# Patient Record
Sex: Male | Born: 1980 | Race: White | Hispanic: No | Smoking: Never smoker
Health system: Southern US, Community
[De-identification: ages and names within clinical notes are randomized; demographics above are authoritative.]

---

## 2010-09-15 ENCOUNTER — Emergency Department (HOSPITAL_COMMUNITY)
Admission: EM | Admit: 2010-09-15 | Discharge: 2010-09-15 | Payer: Self-pay | Source: Home / Self Care | Admitting: Emergency Medicine

## 2010-12-04 LAB — RAPID URINE DRUG SCREEN, HOSP PERFORMED
Amphetamines: NOT DETECTED
Barbiturates: NOT DETECTED
Cocaine: POSITIVE — AB
Tetrahydrocannabinol: POSITIVE — AB

## 2010-12-04 LAB — DIFFERENTIAL
Basophils Absolute: 0 10*3/uL (ref 0.0–0.1)
Monocytes Relative: 11 % (ref 3–12)
Neutro Abs: 8.1 10*3/uL — ABNORMAL HIGH (ref 1.7–7.7)

## 2010-12-04 LAB — CBC
HCT: 39.6 % (ref 39.0–52.0)
MCH: 33.6 pg (ref 26.0–34.0)
MCV: 90 fL (ref 78.0–100.0)
Platelets: 152 10*3/uL (ref 150–400)
RDW: 11.8 % (ref 11.5–15.5)

## 2010-12-04 LAB — BASIC METABOLIC PANEL
GFR calc Af Amer: >60 mL/min (ref >60)
GFR calc non Af Amer: >60 mL/min (ref >60)

## 2011-02-19 ENCOUNTER — Emergency Department: Payer: Self-pay | Admitting: Emergency Medicine

## 2011-05-09 ENCOUNTER — Inpatient Hospital Stay: Payer: Self-pay | Admitting: Internal Medicine

## 2011-11-17 ENCOUNTER — Emergency Department: Payer: Self-pay | Admitting: *Deleted

## 2011-11-17 LAB — BASIC METABOLIC PANEL
Anion Gap: 8 (ref 7–16)
BUN: 7 mg/dL (ref 7–18)
Calcium, Total: 8.9 mg/dL (ref 8.5–10.1)
Chloride: 102 mmol/L (ref 98–107)
Co2: 32 mmol/L (ref 21–32)
Osmolality: 281 (ref 275–301)
Potassium: 3.4 mmol/L — ABNORMAL LOW (ref 3.5–5.1)

## 2011-11-17 LAB — CBC
HGB: 14.8 g/dL (ref 13.0–18.0)
MCH: 31.8 pg (ref 26.0–34.0)
MCV: 94 fL (ref 80–100)
RBC: 4.67 10*6/uL (ref 4.40–5.90)
RDW: 12.8 % (ref 11.5–14.5)

## 2013-02-04 IMAGING — CT CT CHEST W/ CM
1 of 2 series · 14 of 32 positions shown, 19 images · IV contrast (APPLIED)
Comparison: none

REASON FOR EXAM: dyspnea, hypoxia
COMMENTS:

[Series 4: soft tissue · axial · 0.67mm/px · z∈[-338,-38]mm · 14 of 112 slices shown, 19 images]
[im 6/112  soft-tissue]
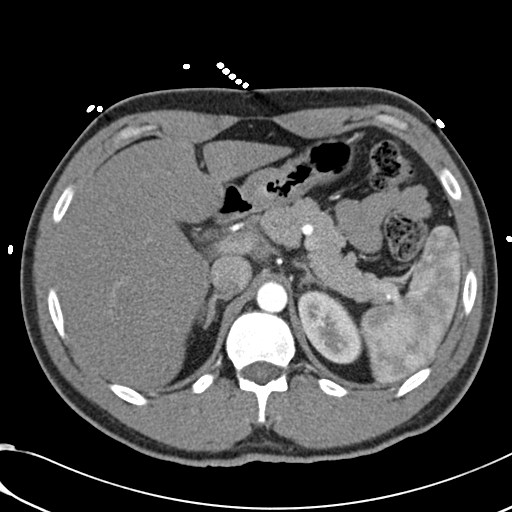
[im 6/112  bone]
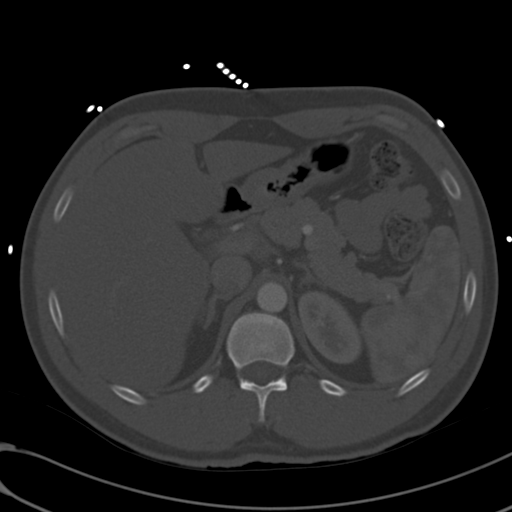
[im 18/112  soft-tissue]
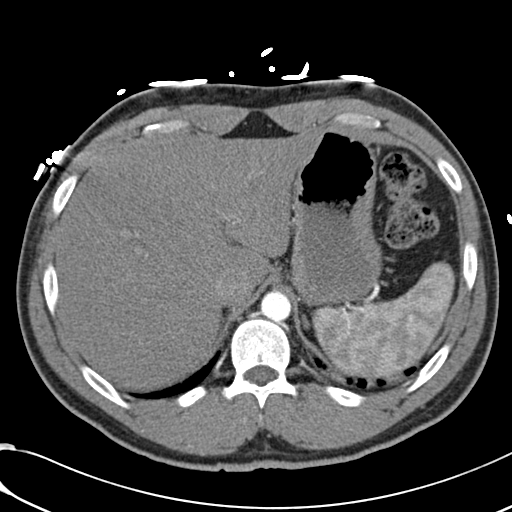
[im 24/112  soft-tissue]
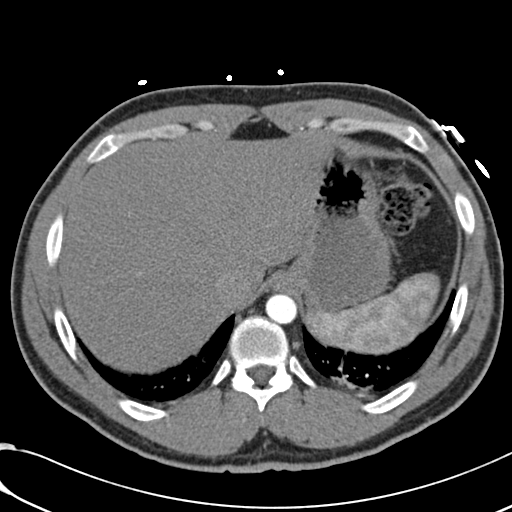
[im 30/112  soft-tissue]
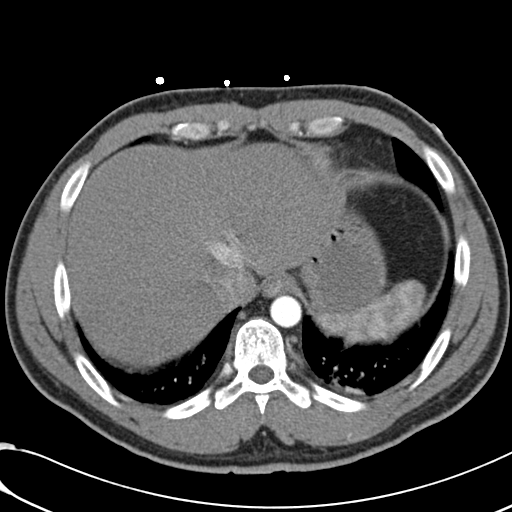
[im 41/112  soft-tissue]
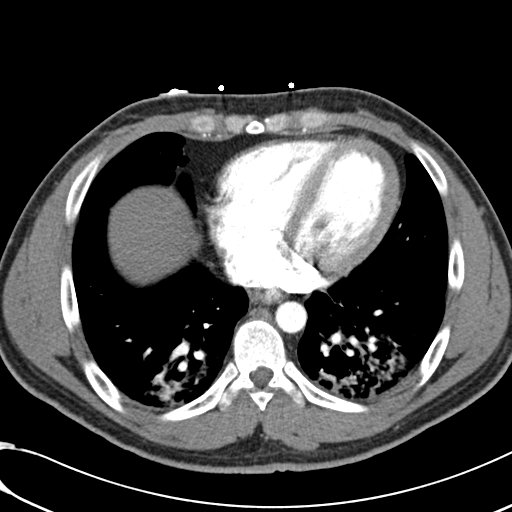
[im 47/112  soft-tissue]
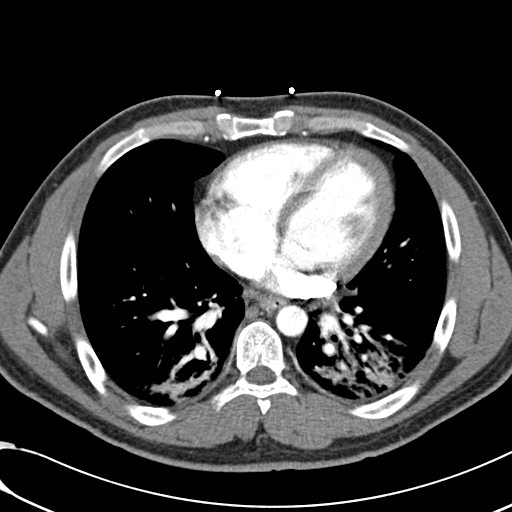
[im 59/112  soft-tissue]
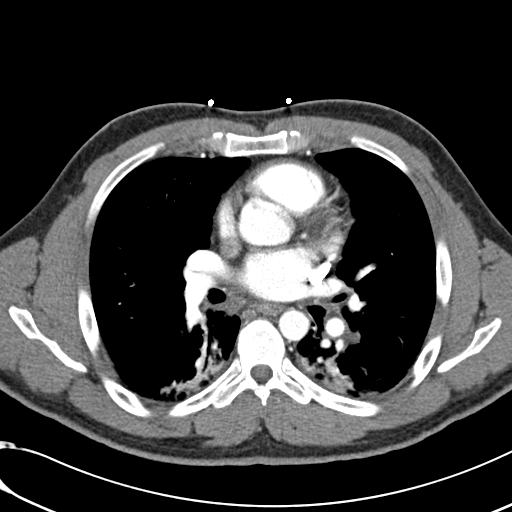
[im 65/112  soft-tissue]
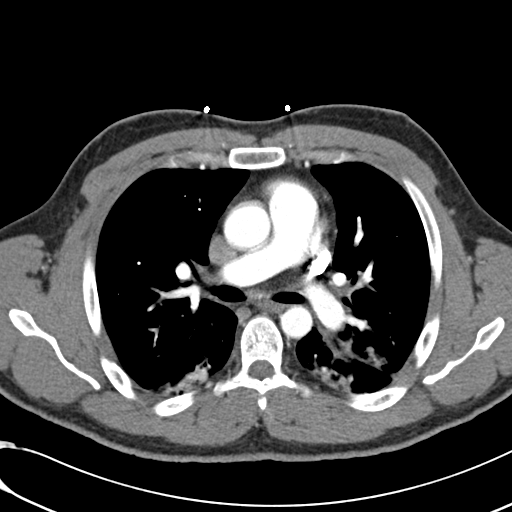
[im 71/112  soft-tissue]
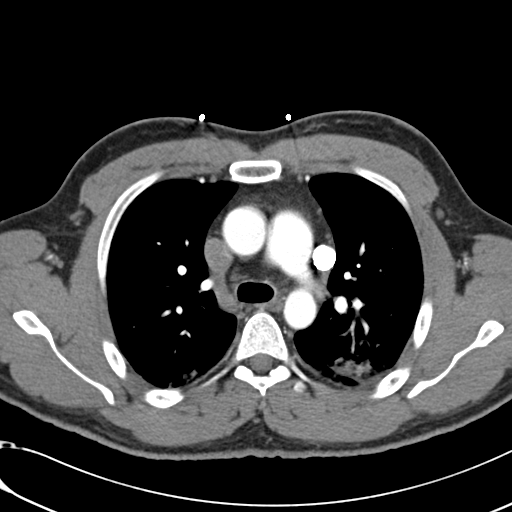
[im 71/112  bone]
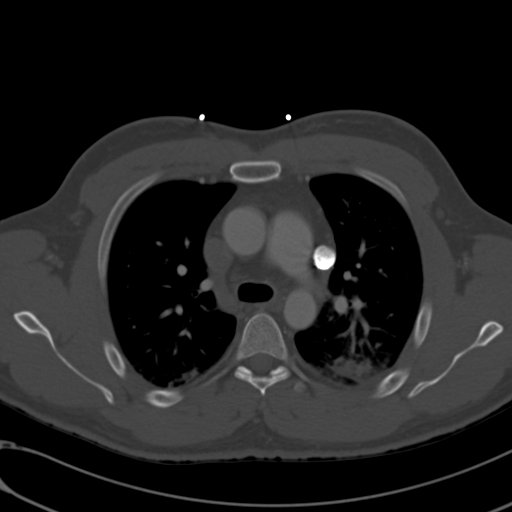
[im 82/112  soft-tissue]
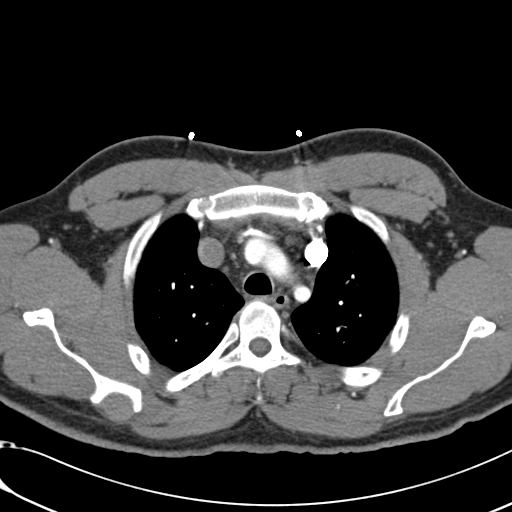
[im 88/112  soft-tissue]
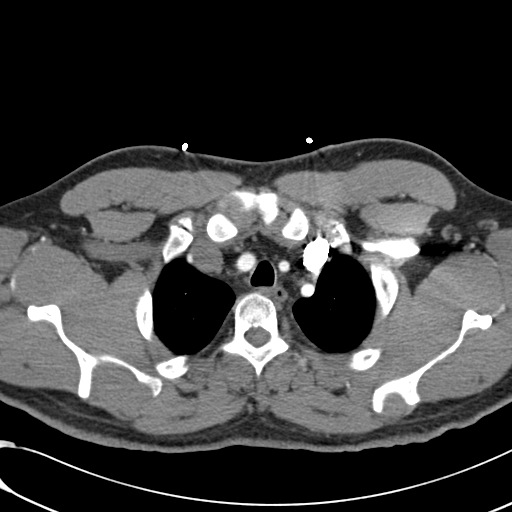
[im 88/112  lung]
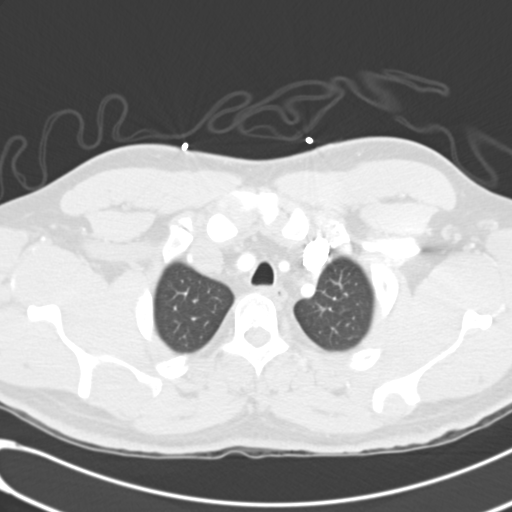
[im 94/112  soft-tissue]
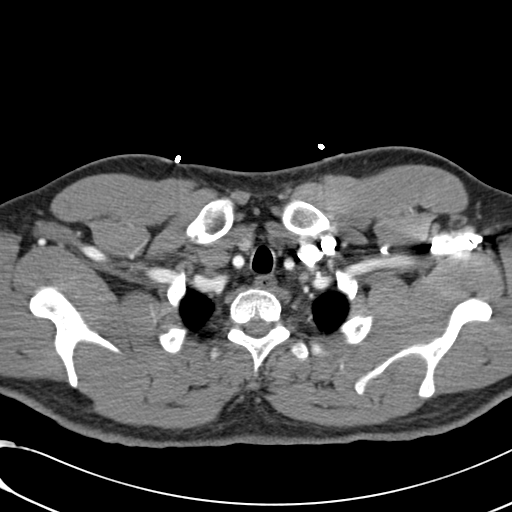
[im 94/112  lung]
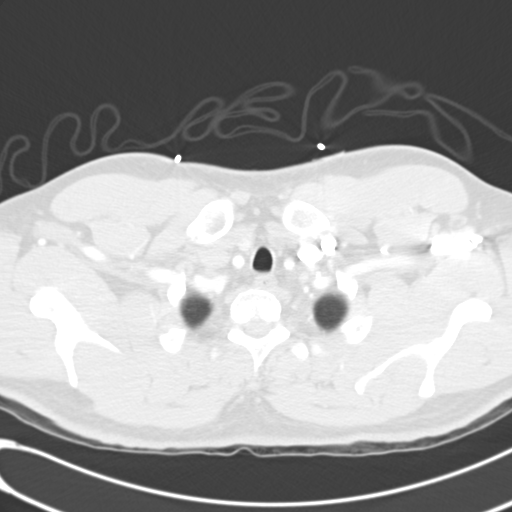
[im 100/112  lung]
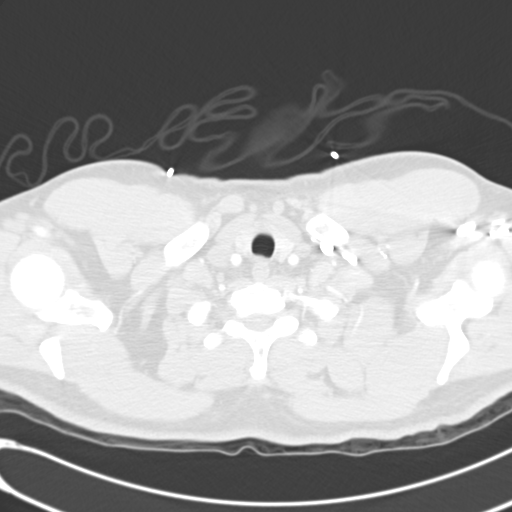
[im 106/112  soft-tissue]
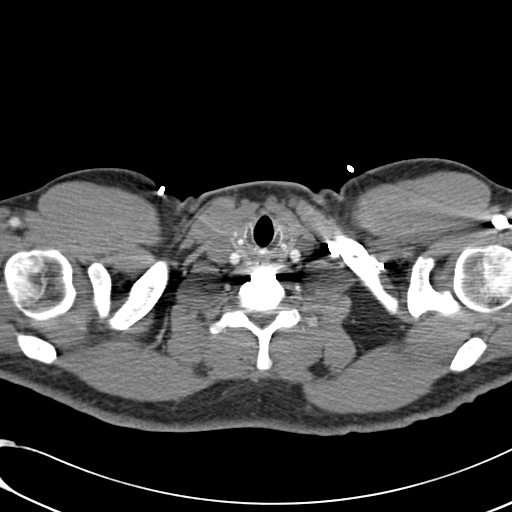
[im 106/112  lung]
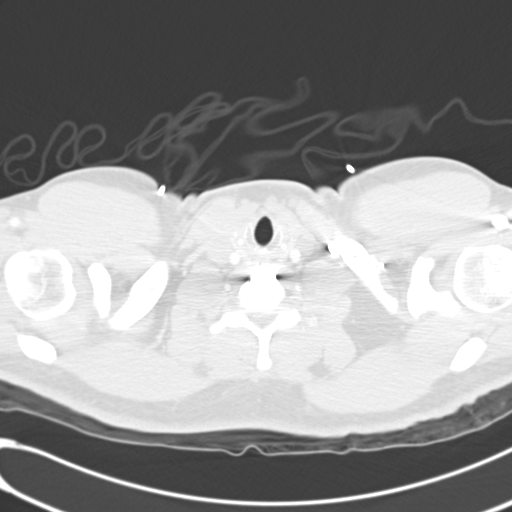

[14 of 32 positions shown; findings below may reference images not displayed]

PROCEDURE:     CT  - CT CHEST (FOR PE) W  - May 09, 2011 [DATE]

RESULT:     Emergent CT of the chest is performed with 75 mL of Zsovue-S14
iodinated intravenous contrast with images reconstructed at 3.0 mm slice
thickness in the axial plane. There is no previous similar study for
comparison.

Bilateral lower lobe airspace disease is present consistent with bilateral
lower lobe pneumonia with some minimal involvement in the left upper lobe
and right upper lobe adjacent to the major fissure. There is no edema,
significant effusion or evidence of pericardial effusion or pneumothorax.
The included upper abdominal structures appear unremarkable. The thoracic
aorta is normal in caliber without evidence of dissection. No pulmonary
embolism is demonstrated.
IMPRESSION: 1. No evidence of PE.
2. No thoracic aortic aneurysm or dissection.
3. Bilateral lower lobe pneumonia with some minimal upper lobe involvement
along the inferior posterior aspect of the upper lobes adjacent to the major
fissures.

## 2013-02-04 IMAGING — CR DG CHEST 1V PORT
1 series · 1 of 1 positions shown · non-contrast
Comparison: none

REASON FOR EXAM: shortness of breath
COMMENTS:

[view not recorded]
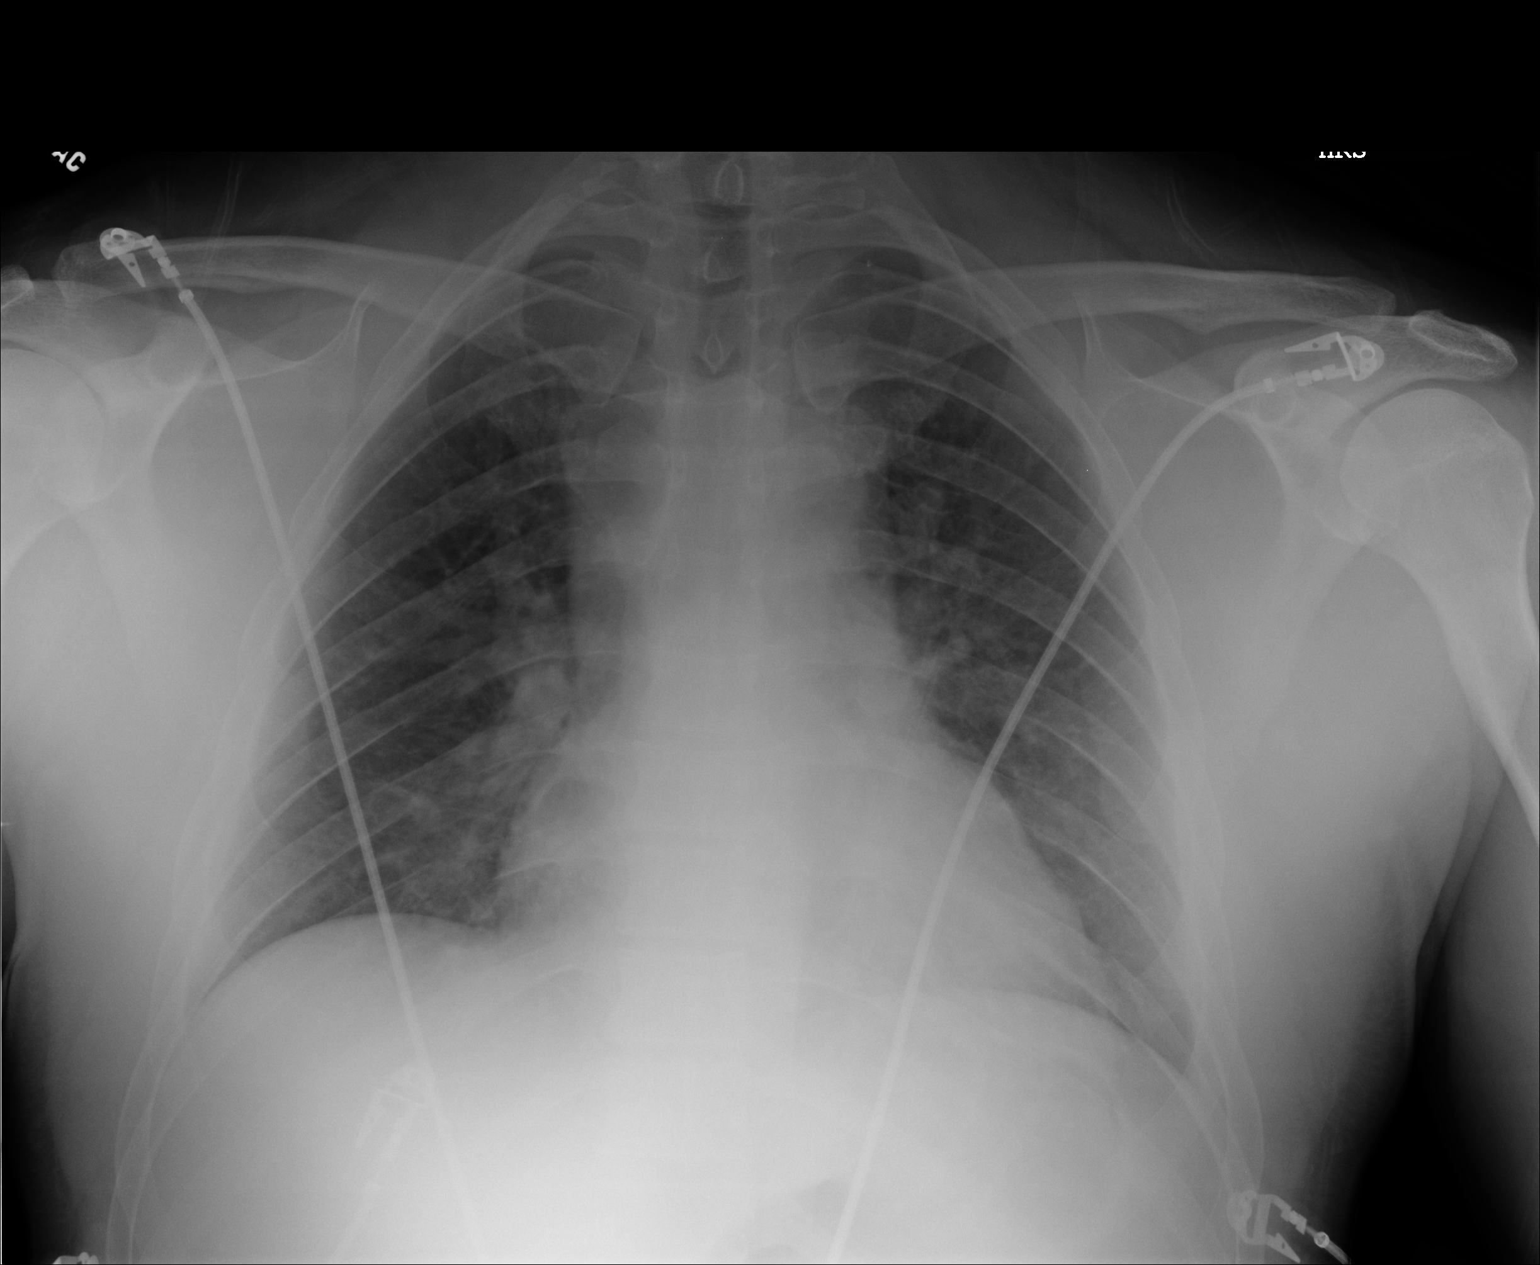

[1 of 1 positions shown; findings below may reference images not displayed]

PROCEDURE:     DXR - DXR PORTABLE CHEST SINGLE VIEW  - May 09, 2011 [DATE]

RESULT:     The lung fields are clear. No pneumonia, pneumothorax or pleural
effusion is seen. No acute changes of the heart or pulmonary vasculature are
identified. Monitoring electrodes are present. Postoperative changes of the
lower cervical spine are noted.
IMPRESSION: 1.     No acute changes are identified.

## 2013-08-15 IMAGING — CR DG CHEST 2V
1 series · 2 of 2 positions shown · non-contrast
Comparison: none

REASON FOR EXAM: cp
COMMENTS:

PROCEDURE:     DXR - DXR CHEST PA (OR AP) AND LATERAL  - November 17, 2011  [DATE]
RESULT:     Comparison: 05/09/2011

[Series 1: w chest ap · 0.14mm/px · 2 of 2 slices shown]
[im 1/2]
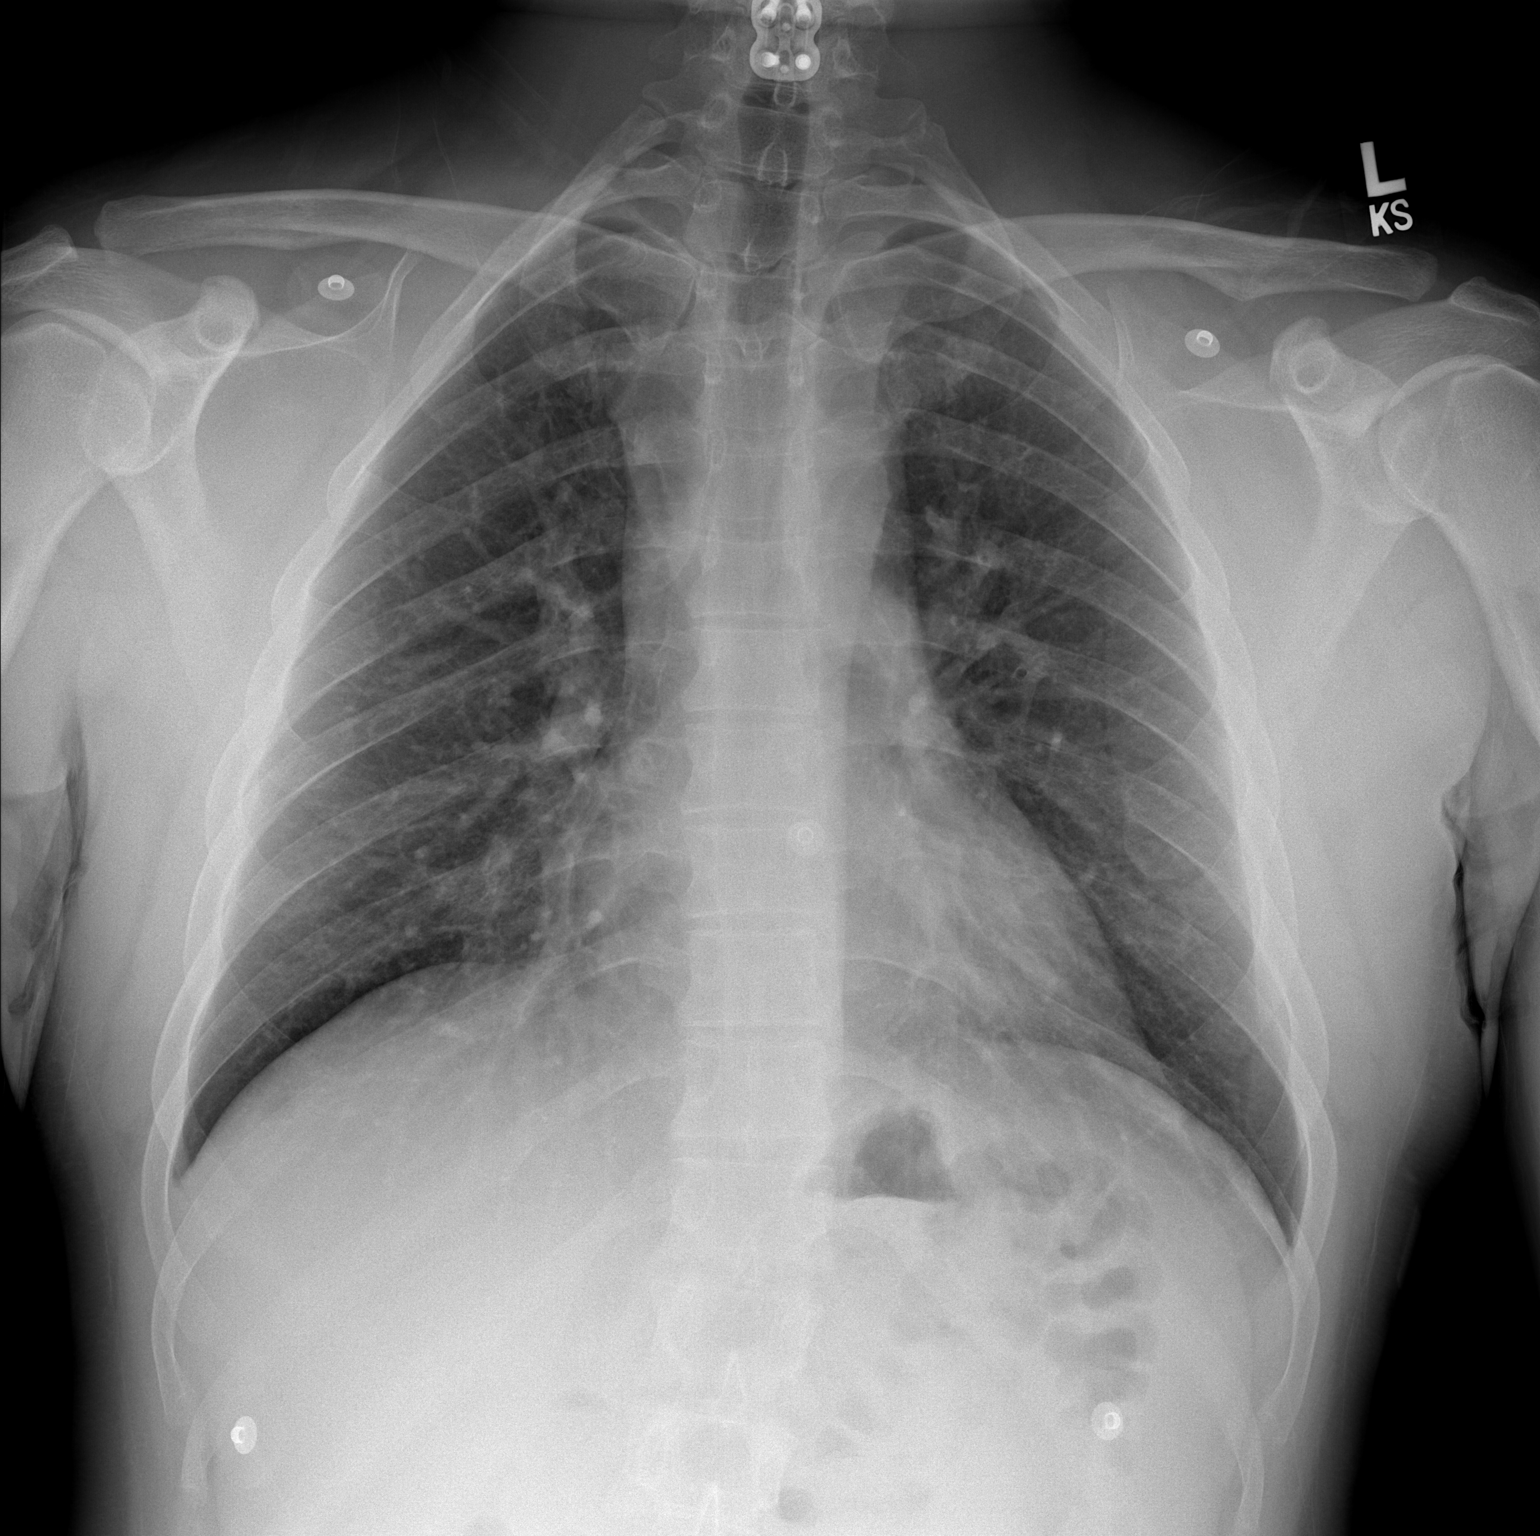
[im 2/2]
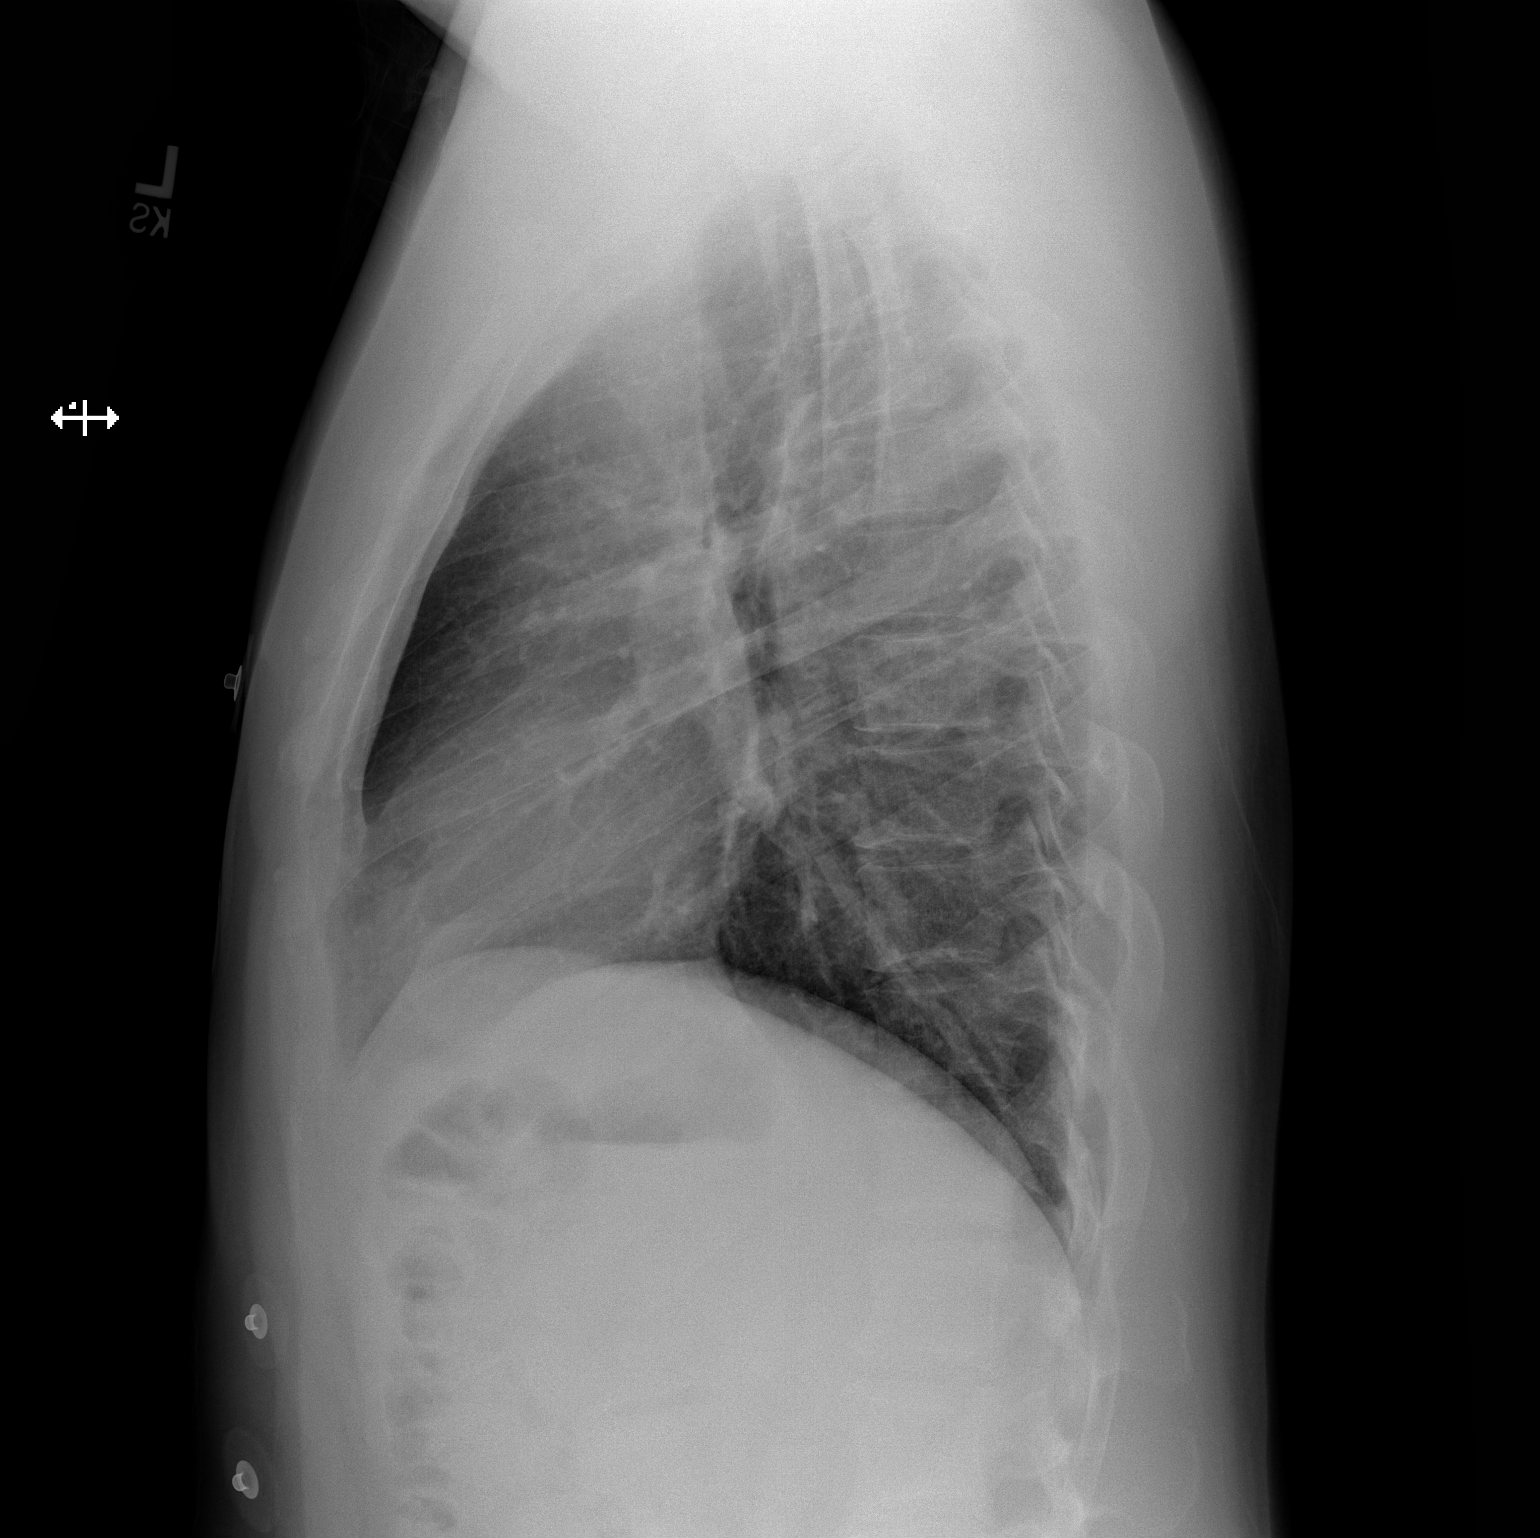

[2 of 2 positions shown; findings below may reference images not displayed]

FINDINGS: The heart and mediastinum are stable. Mild prominence of the superior
mediastinum is similar to prior and likely related to the prominent
mediastinal fat seen on the chest CT of 05/09/2011. This is also likely
related to the persistent left-sided SVC as seen on the chest CT. No focal
pulmonary opacities. Anterior spinal fusion hardware is incompletely
visualized in the cervical spine.
IMPRESSION: No acute cardiopulmonary disease.

## 2015-01-28 ENCOUNTER — Ambulatory Visit
Admission: EM | Admit: 2015-01-28 | Discharge: 2015-01-28 | Disposition: A | Discharge status: Home / Self Care | Payer: 59 | Attending: Family Medicine | Admitting: Family Medicine

## 2015-01-28 DIAGNOSIS — Z0000 Encounter for general adult medical examination without abnormal findings: Secondary | ICD-10-CM

## 2015-01-28 DIAGNOSIS — Z029 Encounter for administrative examinations, unspecified: Secondary | ICD-10-CM

## 2015-01-28 LAB — DEPT OF TRANSP DIPSTICK, URINE (ARMC ONLY)
GLUCOSE, UA: 500 mg/dL — AB
HGB URINE DIPSTICK: NEGATIVE
Protein, ur: NEGATIVE mg/dL
SPECIFIC GRAVITY, URINE: 1.005 (ref 1.005–1.030)

## 2015-01-28 NOTE — ED Notes (Signed)
Whisper test >5 feet bilateral

## 2015-01-28 NOTE — ED Provider Notes (Signed)
CSN: [id number]     Arrival date & time 01/28/15  1438 History   First MD Initiated Contact with Patient 01/28/15 1515     Chief Complaint  Patient presents with  . 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 No pertinent past medical history. History reviewed. No pertinent past surgical history. Family History  Problem Relation Age of Onset  . Diabetes Mother   . Cirrhosis Father    History  Substance Use Topics  . Smoking status: Never Smoker   . Smokeless tobacco: Not on file  . Alcohol Use: No    Review of Systems  Allergies  Review of patient's allergies indicates no known allergies.  Home Medications   Prior to Admission medications   Not on File   BP 127/83 mmHg  Pulse 108  Temp(Src) 97 F (36.1 C) (Tympanic)  Resp 16  Ht 5\' 9"  (1.753 m)  Wt 195 lb (88.451 kg)  BMI 28.78 kg/m2  SpO2 97% Physical Exam  Constitutional: He appears well-developed and well-nourished. No distress.  Skin: He is not diaphoretic.  Nursing note and vitals reviewed.   ED Course  Procedures (including critical care time) Labs Review Labs Reviewed  DEPT OF TRANSP DIPSTICK, URINE([facility])     Imaging Review No results found.   MDM   1. 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

## 2015-01-28 NOTE — ED Notes (Signed)
For DOT Physical 

## 2015-01-28 NOTE — Discharge Instructions (Signed)

## 2023-08-24 ENCOUNTER — Other Ambulatory Visit: Payer: Self-pay

## 2023-08-24 ENCOUNTER — Emergency Department
Admission: EM | Admit: 2023-08-24 | Discharge: 2023-08-24 | Disposition: A | Discharge status: Home / Self Care | Payer: Medicaid Other | Attending: Emergency Medicine | Admitting: Emergency Medicine

## 2023-08-24 DIAGNOSIS — M62830 Muscle spasm of back: Secondary | ICD-10-CM

## 2023-08-24 DIAGNOSIS — M436 Torticollis: Secondary | ICD-10-CM

## 2023-08-24 DIAGNOSIS — M62838 Other muscle spasm: Secondary | ICD-10-CM | POA: Insufficient documentation

## 2023-08-24 DIAGNOSIS — M542 Cervicalgia: Secondary | ICD-10-CM | POA: Diagnosis present

## 2023-08-24 MED ORDER — OXYCODONE HCL 5 MG PO TABS
5.0000 mg | ORAL_TABLET | Freq: Once | ORAL | Status: AC
  Administered 2023-08-24: 5 mg via ORAL
  Filled 2023-08-24: qty 1

## 2023-08-24 MED ORDER — KETOROLAC TROMETHAMINE 30 MG/ML IJ SOLN
30.0000 mg | Freq: Once | INTRAMUSCULAR | Status: AC
  Administered 2023-08-24: 30 mg via INTRAMUSCULAR
  Filled 2023-08-24: qty 1

## 2023-08-24 MED ORDER — LIDOCAINE 5 % EX PTCH
1.0000 | MEDICATED_PATCH | CUTANEOUS | Status: DC
  Administered 2023-08-24: 1 via TRANSDERMAL
  Filled 2023-08-24: qty 1

## 2023-08-24 MED ORDER — METHOCARBAMOL 500 MG PO TABS
500.0000 mg | ORAL_TABLET | Freq: Once | ORAL | Status: AC
  Administered 2023-08-24: 500 mg via ORAL
  Filled 2023-08-24: qty 1

## 2023-08-24 NOTE — ED Notes (Signed)
Patient given discharge instructions including importance of follow up appt with stated understanding. Patient stable and ambulatory to waiting room to await safe ride.

## 2023-08-24 NOTE — ED Triage Notes (Signed)
Patient brought in by [facility] tonight from home with complaints of neck and back pain since around 1am this morning that was sudden and woke him up out of his sleep. Patient endorses total spinal reconstruction in 2012. Denies injury/trauma or loss of bowel/bladder. Tried tylenol and ibuprofen PTA without relief.

## 2023-08-24 NOTE — Discharge Instructions (Addendum)
Please take Tylenol and ibuprofen/Advil for your pain.  It is safe to take them together, or to alternate them every few hours.  Take up to 1000mg of Tylenol at a time, up to 4 times per day.  Do not take more than 4000 mg of Tylenol in 24 hours.  For ibuprofen, take 400-600 mg, 3 - 4 times per day.  

## 2023-08-24 NOTE — ED Provider Notes (Signed)
[facility] Provider Note    Event Date/Time   First MD Initiated Contact with Patient 08/24/23 0530     (approximate)   History   Back Pain and Neck Pain   HPI  Nathaniel Winters is a 42 y.o. male who presents to the ED for evaluation of Back Pain and Neck Pain   Patient presents for a few hours of atraumatic left-sided shoulder and neck pain.  He reports his "whole spine has been reconstructed.  "Multiple fixation surgeries in the past remotely.  Reports no recent illnesses and he was "normal" when he went to bed last night but awoke around 1 AM with severe atraumatic left-sided neck, shoulder and back pain.   Physical Exam   Triage Vital Signs: ED Triage Vitals  Encounter Vitals Group     BP      Systolic BP Percentile      Diastolic BP Percentile      Pulse      Resp      Temp      Temp src      SpO2      Weight      Height      Head Circumference      Peak Flow      Pain Score      Pain Loc      Pain Education      Exclude from Growth Chart     Most recent vital signs: Vitals:   08/24/23 0532 08/24/23 0600  BP: 105/72 (!) 119/90  Pulse:  81  Resp:  16  Temp:  98.2 F (36.8 C)  SpO2:  96%    General: Awake, no distress.  Transfers himself from the EMS stretcher to our own, freely moving all 4 extremities without deficit CV:  Good peripheral perfusion.  Resp:  Normal effort.  Abd:  No distention.  MSK:  No deformity noted.  Tenderness to palpation of left-sided paraspinal cervical musculature and left trapezius musculature.  No overlying skin changes or signs of trauma.  Well-healed midline previous surgical incision Neuro:  No focal deficits appreciated. Other:     ED Results / Procedures / Treatments   Labs (all labs ordered are listed, but only abnormal results are displayed) Labs Reviewed - No data to display  EKG   RADIOLOGY   Official radiology report(s): No results found.  PROCEDURES and  INTERVENTIONS:  Procedures  Medications  lidocaine (LIDODERM) 5 % 1 patch (1 patch Transdermal Patch Applied 08/24/23 0548)  ketorolac (TORADOL) 30 MG/ML injection 30 mg (30 mg Intramuscular Given 08/24/23 0548)  methocarbamol (ROBAXIN) tablet 500 mg (500 mg Oral Given 08/24/23 0548)  oxyCODONE (Oxy IR/ROXICODONE) immediate release tablet 5 mg (5 mg Oral Given 08/24/23 6578)     IMPRESSION / MDM / ASSESSMENT AND PLAN / ED COURSE  I reviewed the triage vital signs and the nursing notes.  Differential diagnosis includes, but is not limited to, torticollis , meningitis, muscular spasm, herpes zoster, cellulitis  {Patient presents with symptoms of an acute illness or injury that is potentially life-threatening.  Patient presents with a couple hours of atraumatic left sided shoulder and neck pain, likely of muscular etiology and c/w torticollis. Will provide nonnarcotic multimodal analgesia and reassess. No signs of trauma, neuro deficits, skin changes or concerning features to indicate serum or radiologic workup at this point.     Clinical Course as of 08/24/23 0630  Sat Aug 24, 2023  0630 Reassessed.  Pain improving.  Suitable for outpatient management [DS]    Clinical Course User Index [DS] Delton Prairie, MD     FINAL CLINICAL IMPRESSION(S) / ED DIAGNOSES   Final diagnoses:  Spasm of left trapezius muscle  Torticollis     Rx / DC Orders   ED Discharge Orders     None        Note:  This document was prepared using Dragon voice recognition software and may include unintentional dictation errors.   Delton Prairie, MD 08/24/23 0630

## 2023-11-28 DIAGNOSIS — B1920 Unspecified viral hepatitis C without hepatic coma: Secondary | ICD-10-CM | POA: Diagnosis not present

## 2023-11-28 DIAGNOSIS — Z1389 Encounter for screening for other disorder: Secondary | ICD-10-CM | POA: Diagnosis not present

## 2023-11-28 DIAGNOSIS — F4325 Adjustment disorder with mixed disturbance of emotions and conduct: Secondary | ICD-10-CM | POA: Diagnosis not present

## 2023-11-28 DIAGNOSIS — F1120 Opioid dependence, uncomplicated: Secondary | ICD-10-CM | POA: Diagnosis not present

## 2023-11-28 DIAGNOSIS — M5412 Radiculopathy, cervical region: Secondary | ICD-10-CM | POA: Diagnosis not present

## 2023-11-28 DIAGNOSIS — G4700 Insomnia, unspecified: Secondary | ICD-10-CM | POA: Diagnosis not present

## 2023-12-26 DIAGNOSIS — F4325 Adjustment disorder with mixed disturbance of emotions and conduct: Secondary | ICD-10-CM | POA: Diagnosis not present

## 2023-12-26 DIAGNOSIS — Z23 Encounter for immunization: Secondary | ICD-10-CM | POA: Diagnosis not present

## 2023-12-26 DIAGNOSIS — Z1331 Encounter for screening for depression: Secondary | ICD-10-CM | POA: Diagnosis not present

## 2023-12-26 DIAGNOSIS — M5412 Radiculopathy, cervical region: Secondary | ICD-10-CM | POA: Diagnosis not present

## 2023-12-26 DIAGNOSIS — F1120 Opioid dependence, uncomplicated: Secondary | ICD-10-CM | POA: Diagnosis not present

## 2023-12-26 DIAGNOSIS — G4700 Insomnia, unspecified: Secondary | ICD-10-CM | POA: Diagnosis not present

## 2023-12-26 DIAGNOSIS — Z0000 Encounter for general adult medical examination without abnormal findings: Secondary | ICD-10-CM | POA: Diagnosis not present

## 2023-12-26 DIAGNOSIS — Z1389 Encounter for screening for other disorder: Secondary | ICD-10-CM | POA: Diagnosis not present

## 2023-12-26 DIAGNOSIS — B1920 Unspecified viral hepatitis C without hepatic coma: Secondary | ICD-10-CM | POA: Diagnosis not present
